# Patient Record
Sex: Female | Born: 1965 | Race: White | Hispanic: Yes | Marital: Married | Smoking: Never smoker
Health system: Southern US, Community
[De-identification: ages and names within clinical notes are randomized; demographics above are authoritative.]

## PROBLEM LIST (undated history)

## (undated) DIAGNOSIS — I10 Essential (primary) hypertension: Secondary | ICD-10-CM

## (undated) DIAGNOSIS — N200 Calculus of kidney: Secondary | ICD-10-CM

## (undated) DIAGNOSIS — C189 Malignant neoplasm of colon, unspecified: Secondary | ICD-10-CM

## (undated) DIAGNOSIS — C801 Malignant (primary) neoplasm, unspecified: Secondary | ICD-10-CM

## (undated) DIAGNOSIS — E119 Type 2 diabetes mellitus without complications: Secondary | ICD-10-CM

---

## 2007-12-30 ENCOUNTER — Emergency Department: Payer: Self-pay | Admitting: Emergency Medicine

## 2007-12-30 ENCOUNTER — Other Ambulatory Visit: Payer: Self-pay

## 2008-01-23 ENCOUNTER — Ambulatory Visit: Payer: Self-pay | Admitting: Specialist

## 2015-02-16 ENCOUNTER — Encounter: Payer: Self-pay | Admitting: Emergency Medicine

## 2015-02-16 ENCOUNTER — Emergency Department
Admission: EM | Admit: 2015-02-16 | Discharge: 2015-02-16 | Disposition: A | Discharge status: Home / Self Care | Payer: BLUE CROSS/BLUE SHIELD | Attending: Emergency Medicine | Admitting: Emergency Medicine

## 2015-02-16 DIAGNOSIS — N200 Calculus of kidney: Secondary | ICD-10-CM

## 2015-02-16 DIAGNOSIS — E119 Type 2 diabetes mellitus without complications: Secondary | ICD-10-CM | POA: Diagnosis not present

## 2015-02-16 DIAGNOSIS — R109 Unspecified abdominal pain: Secondary | ICD-10-CM | POA: Diagnosis present

## 2015-02-16 HISTORY — DX: Type 2 diabetes mellitus without complications: E11.9

## 2015-02-16 HISTORY — DX: Calculus of kidney: N20.0

## 2015-02-16 LAB — URINALYSIS COMPLETE WITH MICROSCOPIC (ARMC ONLY)
Bacteria, UA: NONE SEEN
Bilirubin Urine: NEGATIVE
Glucose, UA: >500 mg/dL — AB
LEUKOCYTES UA: NEGATIVE
Nitrite: NEGATIVE
PH: 5 (ref 5.0–8.0)
PROTEIN: NEGATIVE mg/dL
Specific Gravity, Urine: 1.022 (ref 1.005–1.030)

## 2015-02-16 LAB — CBC WITH DIFFERENTIAL/PLATELET
BASOS PCT: 1 %
Basophils Absolute: 0 10*3/uL (ref 0–0.1)
EOS PCT: 1 %
Eosinophils Absolute: 0.1 10*3/uL (ref 0–0.7)
HCT: 42.5 % (ref 35.0–47.0)
Hemoglobin: 14.3 g/dL (ref 12.0–16.0)
Lymphocytes Relative: 15 %
Lymphs Abs: 1.2 10*3/uL (ref 1.0–3.6)
MCH: 29.5 pg (ref 26.0–34.0)
MCHC: 33.5 g/dL (ref 32.0–36.0)
MCV: 88 fL (ref 80.0–100.0)
MONO ABS: 0.6 10*3/uL (ref 0.2–0.9)
Monocytes Relative: 7 %
Neutro Abs: 6.3 10*3/uL (ref 1.4–6.5)
Neutrophils Relative %: 76 %
Platelets: 259 10*3/uL (ref 150–440)
RBC: 4.83 MIL/uL (ref 3.80–5.20)
RDW: 12.7 % (ref 11.5–14.5)
WBC: 8.2 10*3/uL (ref 3.6–11.0)

## 2015-02-16 LAB — COMPREHENSIVE METABOLIC PANEL
ALK PHOS: 117 U/L (ref 38–126)
ALT: 21 U/L (ref 14–54)
AST: 22 U/L (ref 15–41)
Albumin: 4.1 g/dL (ref 3.5–5.0)
Anion gap: 8 (ref 5–15)
BUN: 20 mg/dL (ref 6–20)
CALCIUM: 9.2 mg/dL (ref 8.9–10.3)
CHLORIDE: 102 mmol/L (ref 101–111)
CO2: 25 mmol/L (ref 22–32)
Creatinine, Ser: 0.79 mg/dL (ref 0.44–1.00)
GFR calc Af Amer: >60 mL/min (ref >60)
Glucose, Bld: 278 mg/dL — ABNORMAL HIGH (ref 65–99)
Potassium: 4.1 mmol/L (ref 3.5–5.1)
SODIUM: 135 mmol/L (ref 135–145)
Total Bilirubin: 0.5 mg/dL (ref 0.3–1.2)
Total Protein: 7.7 g/dL (ref 6.5–8.1)

## 2015-02-16 MED ORDER — HYDROCODONE-ACETAMINOPHEN 5-325 MG PO TABS: 1.0000 | ORAL_TABLET | ORAL | Status: DC | PRN

## 2015-02-16 MED ORDER — NAPROXEN 500 MG PO TABS: 500.0000 mg | ORAL_TABLET | Freq: Two times a day (BID) | ORAL | Status: DC

## 2015-02-16 NOTE — ED Notes (Signed)
Pt arrived to the ED for complaints of flank pain since this AM. Pt states that she "suffers of kidney stones and this feels just like it." Pt denies dysuria or hematuria. Pt is AOx4 in no apparent distress.

## 2015-02-16 NOTE — ED Provider Notes (Signed)
[facility] Emergency Department Provider Note  ____________________________________________  Time seen: On arrival  I have reviewed the triage vital signs and the nursing notes.   HISTORY  Chief Complaint Flank Pain      HPI Penny Huber is a 49 y.o. female who reports she developed flank pain at approximately 7 hours ago today. She has a history of kidney stones in the past and today's episode felt similar. She felt sharp pain in her left flank abruptly the pain would come and go for several hours and was on moderate in nature. She has not had any pain in 3 hours however so she is hopeful that she may have passed it. She denies any fevers chills. No nausea no vomiting. Normal bowel movements     Past Medical History  Diagnosis Date  . Kidney stones   . Diabetes mellitus without complication     There are no active problems to display for this patient.   Past Surgical History  Procedure Laterality Date  . Cesarean section      Current Outpatient Rx  Name  Route  Sig  Dispense  Refill  . HYDROcodone-acetaminophen (NORCO/VICODIN) 5-325 MG per tablet   Oral   Take 1 tablet by mouth every 4 (four) hours as needed for moderate pain.   20 tablet   0   . naproxen (NAPROSYN) 500 MG tablet   Oral   Take 1 tablet (500 mg total) by mouth 2 (two) times daily with a meal.   20 tablet   2     Allergies Review of patient's allergies indicates no known allergies.  History reviewed. No pertinent family history.  Social History History  Substance Use Topics  . Smoking status: Never Smoker   . Smokeless tobacco: Not on file  . Alcohol Use: No    Review of Systems  Constitutional: Negative for fever. Eyes: Negative for visual changes. ENT: Negative for sore throat Cardiovascular: Negative for chest pain. Respiratory: Negative for shortness of breath. Gastrointestinal: Negative for abdominal pain, vomiting and  diarrhea. Genitourinary: Negative for dysuria. Musculoskeletal: Negative for back pain. Skin: Negative for rash. Neurological: Negative for headaches or focal weakness   10-point ROS otherwise negative.  ____________________________________________   PHYSICAL EXAM:  VITAL SIGNS: ED Triage Vitals  Enc Vitals Group     BP 02/16/15 1842 144/76 mmHg     Pulse Rate 02/16/15 1842 88     Resp 02/16/15 1842 18     Temp 02/16/15 1842 98.1 F (36.7 C)     Temp Source 02/16/15 1842 Oral     SpO2 02/16/15 1842 95 %     Weight 02/16/15 1842 178 lb (80.74 kg)     Height 02/16/15 1842 5\' 6"  (1.676 m)     Head Cir --      Peak Flow --      Pain Score 02/16/15 1920 6     Pain Loc --      Pain Edu? --      Excl. in [place]? --      Constitutional: Alert and oriented. Well appearing and in no distress. Eyes: Conjunctivae are normal. PERRL. ENT   Head: Normocephalic and atraumatic.   Nose: No rhinnorhea.   Mouth/Throat: Mucous membranes are moist.  Gastrointestinal: Soft and non-tender in all quadrants. No distention. There is no CVA tenderness. Genitourinary: deferred Musculoskeletal: Nontender with normal range of motion in all extremities. No lower extremity tenderness nor edema. Neurologic:  Normal speech  and language. No gross focal neurologic deficits are appreciated. Skin:  Skin is warm, dry and intact. No rash noted. Psychiatric: Mood and affect are normal. Patient exhibits appropriate insight and judgment.  ____________________________________________    LABS (pertinent positives/negatives)  Labs Reviewed  URINALYSIS COMPLETEWITH MICROSCOPIC ([place] ONLY) - Abnormal; Notable for the following:    Color, Urine STRAW (*)    APPearance CLEAR (*)    Glucose, UA >500 (*)    Ketones, ur TRACE (*)    Hgb urine dipstick 3+ (*)    Squamous Epithelial / LPF 0-5 (*)    All other components within normal limits  COMPREHENSIVE METABOLIC PANEL - Abnormal; Notable for the  following:    Glucose, Bld 278 (*)    All other components within normal limits  CBC WITH DIFFERENTIAL/PLATELET    ____________________________________________   EKG  None  ____________________________________________    RADIOLOGY  None  ____________________________________________   PROCEDURES  Procedure(s) performed: none  Critical Care performed: none  ____________________________________________   INITIAL IMPRESSION / ASSESSMENT AND PLAN / ED COURSE  Pertinent labs & imaging results that were available during my care of the patient were reviewed by me and considered in my medical decision making (see chart for details).  Patient well-appearing and in no distress. Positive hemoglobin and her urinalysis consistent with a kidney stone. Patient has no pain so perhaps she is a past. I'll prescribe stronger analgesic's in case she has a return of her pain. Return precautions given, including fever or chills, nausea vomiting, intractable pain  ____________________________________________   FINAL CLINICAL IMPRESSION(S) / ED DIAGNOSES  Final diagnoses:  Kidney stone     Lavonia Drafts, MD 02/16/15 2021

## 2015-02-16 NOTE — Discharge Instructions (Signed)
Clculos renales (Kidney Stones) Los clculos renales (urolitiasis) son masas slidas que se forman en el interior de los riones. El dolor intenso es causado por el movimiento de la piedra a travs del rin, urter, vejiga y Barista (tracto urinario). Cuando la piedra se mueve, el urter hace un espasmo alrededor de la misma. El clculo generalmente se elimina con el pis (la [place]).  CUIDADOS EN EL HOGAR  Debe ingerir gran cantidad de lquido para mantener la orina de tono claro o color amarillo plido. Esto ayudar a eliminar la piedra.  Cuele la orina con el colador que le han provisto. Noorine de otra forma que no sea a travs del Administrator, ni siquiera una vez. Si elimina la piedra, se retendr en el colador. Puede ser tan pequea como un grano de sal. Llvela a su visita con el mdico. Esto ayudar a que el mdico le indique qu puede hacer para tratar de prevenir la ocurrencia de nuevos clculos renales.  Slo tome los medicamentos que le haya indicado su mdico.  Concurra a las consultas de control con el mdico, segn las indicaciones.  Hgase las radiografas segn las indicaciones de su mdico. SOLICITE AYUDA SI: Siente un dolor que empeora an tomando analgsicos. SOLICITE AYUDA DE INMEDIATO SI:   El dolor no mejora con los medicamentos recetados.  Siente escalofros o fiebre.  El dolor aumenta y Ambulance person en las siguientes 18 horas.  Siente un nuevo dolor en el vientre (abdominal).  Sufre mareos o se desmaya.  Nota que no puede orinar. ASEGRESE DE QUE:   Comprende estas instrucciones.  Controlar su afeccin.  Recibir ayuda de inmediato si no mejora o si empeora. Document Released: 11/17/2008 Document Revised: 04/23/2013 [facility] Patient Information 2015 [place]. This information is not intended to replace advice given to you by your health care provider. Make sure you discuss any questions you have with your health care provider.

## 2015-08-14 ENCOUNTER — Emergency Department
Admission: EM | Admit: 2015-08-14 | Discharge: 2015-08-14 | Disposition: A | Discharge status: Home / Self Care | Payer: BLUE CROSS/BLUE SHIELD | Attending: Emergency Medicine | Admitting: Emergency Medicine

## 2015-08-14 ENCOUNTER — Encounter: Payer: Self-pay | Admitting: Emergency Medicine

## 2015-08-14 ENCOUNTER — Emergency Department: Payer: BLUE CROSS/BLUE SHIELD

## 2015-08-14 DIAGNOSIS — W01198A Fall on same level from slipping, tripping and stumbling with subsequent striking against other object, initial encounter: Secondary | ICD-10-CM | POA: Insufficient documentation

## 2015-08-14 DIAGNOSIS — E119 Type 2 diabetes mellitus without complications: Secondary | ICD-10-CM | POA: Insufficient documentation

## 2015-08-14 DIAGNOSIS — Y9389 Activity, other specified: Secondary | ICD-10-CM | POA: Diagnosis not present

## 2015-08-14 DIAGNOSIS — Y998 Other external cause status: Secondary | ICD-10-CM | POA: Diagnosis not present

## 2015-08-14 DIAGNOSIS — Y9289 Other specified places as the place of occurrence of the external cause: Secondary | ICD-10-CM | POA: Insufficient documentation

## 2015-08-14 DIAGNOSIS — S60221A Contusion of right hand, initial encounter: Secondary | ICD-10-CM

## 2015-08-14 DIAGNOSIS — S6991XA Unspecified injury of right wrist, hand and finger(s), initial encounter: Secondary | ICD-10-CM | POA: Diagnosis present

## 2015-08-14 MED ORDER — NAPROXEN 500 MG PO TABS: 500.0000 mg | ORAL_TABLET | Freq: Two times a day (BID) | ORAL | Status: DC

## 2015-08-14 MED ORDER — HYDROCODONE-ACETAMINOPHEN 5-325 MG PO TABS: 1.0000 | ORAL_TABLET | ORAL | Status: DC | PRN

## 2015-08-14 NOTE — ED Notes (Signed)
States she tried to catch herself when she was falling 2 days ago  Hit hand on rail  Swelling noted to right hand

## 2015-08-14 NOTE — ED Notes (Signed)
Stratus interpretor used. Penny Huber

## 2015-08-14 NOTE — ED Provider Notes (Signed)
[facility] Emergency Department Provider Note  ____________________________________________  Time seen: Approximately 2:26 PM  I have reviewed the triage vital signs and the nursing notes.   HISTORY  Chief Complaint Hand Pain    HPI Penny Huber is a 49 y.o. female resents for evaluation of right hand pain. Husband states that she was trying to catch herself and she is fallen 2 days ago and hit her head on a rail concerned about fracture. Complains of continued pain.   Past Medical History  Diagnosis Date  . Kidney stones   . Diabetes mellitus without complication ([place])     There are no active problems to display for this patient.   Past Surgical History  Procedure Laterality Date  . Cesarean section      Current Outpatient Rx  Name  Route  Sig  Dispense  Refill  . HYDROcodone-acetaminophen (NORCO) 5-325 MG tablet   Oral   Take 1-2 tablets by mouth every 4 (four) hours as needed for moderate pain.   15 tablet   0   . naproxen (NAPROSYN) 500 MG tablet   Oral   Take 1 tablet (500 mg total) by mouth 2 (two) times daily with a meal.   60 tablet   0     Allergies Review of patient's allergies indicates no known allergies.  History reviewed. No pertinent family history.  Social History Social History  Substance Use Topics  . Smoking status: Never Smoker   . Smokeless tobacco: None  . Alcohol Use: No    Review of Systems Constitutional: No fever/chills Eyes: No visual changes. ENT: No sore throat. Cardiovascular: Denies chest pain. Respiratory: Denies shortness of breath. Gastrointestinal: No abdominal pain.  No nausea, no vomiting.  No diarrhea.  No constipation. Genitourinary: Negative for dysuria. Musculoskeletal: Positive for right hand pain Skin: Negative for rash. Neurological: Negative for headaches, focal weakness or numbness.  10-point ROS otherwise  negative.  ____________________________________________   PHYSICAL EXAM:  VITAL SIGNS: ED Triage Vitals  Enc Vitals Group     BP 08/14/15 1327 185/99 mmHg     Pulse Rate 08/14/15 1327 73     Resp 08/14/15 1327 18     Temp 08/14/15 1327 97.8 F (36.6 C)     Temp Source 08/14/15 1327 Oral     SpO2 08/14/15 1327 96 %     Weight 08/14/15 1327 178 lb (80.74 kg)     Height 08/14/15 1327 5\' 5"  (1.651 m)     Head Cir --      Peak Flow --      Pain Score 08/14/15 1328 9     Pain Loc --      Pain Edu? --      Excl. in [place]? --     Constitutional: Alert and oriented. Well appearing and in no acute distress. Cardiovascular: Normal rate, regular rhythm. Grossly normal heart sounds.  Good peripheral circulation. Respiratory: Normal respiratory effort.  No retractions. Lungs CTAB. Gastrointestinal: Soft and nontender. No distention. No abdominal bruits. No CVA tenderness. Musculoskeletal: Right hand with limited range of motion increased tenderness to the lateral aspect of the hand distally neurovascularly intact. Neurologic:  Normal speech and language. No gross focal neurologic deficits are appreciated. No gait instability. Skin:  Skin is warm, dry and intact. No rash noted. Psychiatric: Mood and affect are normal. Speech and behavior are normal.  ____________________________________________   LABS (all labs ordered are listed, but only abnormal results are displayed)  Labs Reviewed - No data to display  RADIOLOGY  FINDINGS: No evidence of acute fracture or dislocation. Joint spaces well preserved. Well-preserved bone mineral density. No intrinsic osseous abnormalities.  IMPRESSION: Normal examination. ____________________________________________   PROCEDURES  Procedure(s) performed: None  Critical Care performed: No  ____________________________________________   INITIAL IMPRESSION / ASSESSMENT AND PLAN / ED COURSE  Pertinent labs & imaging results that were  available during my care of the patient were reviewed by me and considered in my medical decision making (see chart for details).  Right hip pain contusion. Rx given for ibuprofen and Naprosyn 500 mg and hydrocodone 5/325 patient to wear bulky dressing as needed for comfort. Incidental hypertension noted. Patient to follow up with PCP for further evaluation of blood pressure. She voices no other emergency medical complaints at this time ____________________________________________   FINAL CLINICAL IMPRESSION(S) / ED DIAGNOSES  Final diagnoses:  Hand contusion, right, initial encounter      Arlyss Repress, PA-C 08/14/15 1504  Lavonia Drafts, MD 08/14/15 [phone]

## 2015-08-14 NOTE — ED Notes (Signed)
Pt was about to fall and hit hand on rail when trying to catch herself. Right hand began hurting at 4 am today. Mild swelling.

## 2015-08-14 NOTE — Discharge Instructions (Signed)
Contusin en la mano  (Hand Contusion)  Una contusin en la mano es un hematoma profundo en esa zona. Las contusiones son el resultado de una lesin que causa sangrado debajo de la piel. La zona de la contusin puede ponerse [place], [place] o [place]. Las lesiones menores no causan Social research officer, government, Armed forces training and education officer las ms graves pueden presentar dolor e inflamacin durante un par de semanas. CAUSAS  La causa de la contusin generalmente es un golpe, un traumatismo o una fuerza directa ejercida sobre una zona del cuerpo.  SNTOMAS   Hinchazn y enrojecimiento en la zona lesionada.  Cambios de coloracin de la piel en esa zona.  Sensibilidad y Management consultant.  Dolor. DIAGNSTICO  El diagnstico puede hacerse realizando una historia clnica y un examen fsico. Podra ser necesario tomar una radiografa, tomografa computada (TC) o una resonancia magntica (RMN) para determinar si hubo lesiones asociadas, como por ejemplo huesos rotos (fracturas).  TRATAMIENTO  El mejor tratamiento para la contusin en la mano es el reposo, la aplicacin de hielo, la elevacin de la zona y la aplicacin de compresas fras en la zona de la lesin. Para calmar el dolor tambin podrn indicarle medicamentos de venta libre.  INSTRUCCIONES PARA EL CUIDADO EN EL HOGAR   Aplique hielo sobre la zona lesionada.  Ponga el hielo en una bolsa plstica.  Colquese una toalla entre la piel y la bolsa de hielo.  Deje el hielo durante 15 a 20 minutos, 3 a 4 veces por da.  Tome slo medicamentos de venta libre o recetados, segn las indicaciones del mdico. El mdico podr indicarle que evite tomar antiinflamatorios (aspirina, ibuprofeno y naproxeno) durante 71 horas ya que estos medicamentos pueden aumentar los hematomas.  Use un vendaje elstico si se lo indican. Esto ayuda a Forensic psychologist. Puede retirar el vendaje para dormir, darse Collene Mares y baarse. Si los dedos estn adormecidos, fros o de [employer], retire el vendaje y  vuelva a colocarlo de manera ms floja.  Eleve la mano con almohadas para reducir la hinchazn.  Evite el uso excesivo de la mano si le duele. SOLICITE ATENCIN MDICA DE INMEDIATO SI:   Observa un aumento del enrojecimiento, la hinchazn o dolor en la mano.  La hinchazn o el dolor no se [employer].  Tiene prdida de la sensibilidad en la mano o no puede mover los dedos.  La mano est fra o de [employer].  Siente dolor al [employer] dedos.  La mano est caliente al tacto.  La contusin no mejora en el trmino de 2 das. ASEGRESE DE QUE:   Comprende estas instrucciones.  Controlar su enfermedad.  Solicitar ayuda de inmediato si no mejora o si empeora.   Esta informacin no tiene Marine scientist el consejo del mdico. Asegrese de hacerle al mdico cualquier pregunta que tenga.   Document Released: 08/21/2005 Document Revised: 05/15/2012 Elsevier Interactive Patient Education [employer].

## 2019-11-26 ENCOUNTER — Ambulatory Visit
Admission: RE | Admit: 2019-11-26 | Discharge: 2019-11-26 | Disposition: A | Discharge status: Home / Self Care | Payer: Self-pay | Source: Ambulatory Visit | Attending: Oncology | Admitting: Oncology

## 2019-11-26 ENCOUNTER — Ambulatory Visit: Payer: Self-pay | Attending: Oncology

## 2019-11-26 VITALS — BP 142/83 | HR 72 | Temp 97.7°F | Ht 64.0 in | Wt 166.9 lb

## 2019-11-26 DIAGNOSIS — Z0000 Encounter for general adult medical examination without abnormal findings: Secondary | ICD-10-CM | POA: Insufficient documentation

## 2019-11-26 NOTE — Progress Notes (Signed)
  Subjective:     Patient ID: Penny Huber, female   DOB: 1966/06/23, 54 y.o.   MRN: [id number]  HPI   Review of Systems     Objective:   Physical Exam Chest:     Breasts:        Right: No swelling, bleeding, inverted nipple, mass, nipple discharge, skin change or tenderness.        Left: No swelling, bleeding, inverted nipple, mass, nipple discharge, skin change or tenderness.        Assessment:     54 year old Hispanicpatient presents for [facility] clinic visit. Adin Hector interpreted exam.   Patient screened, and meets [facility] eligibility.  Patient does not have insurance, Medicare or Medicaid. Instructed patient on breast self awareness using teach back method.  Clinical breast exam unremarkable. No mass or lump palpted.  Reports cyclical right breast tenderness.  Denies any pain today. Patient will be contacted to reschedule pap. She is still spotting from current menstrual cycle.  Risk Assessment    Risk Scores      11/26/2019   Last edited by: Theodore Demark, RN   5-year risk: 0.8 %   Lifetime risk: 6.1 %             Plan:     Sent for bilateral screening mammogram.

## 2019-12-02 ENCOUNTER — Other Ambulatory Visit: Payer: Self-pay | Admitting: *Deleted

## 2019-12-02 ENCOUNTER — Inpatient Hospital Stay
Admission: RE | Admit: 2019-12-02 | Discharge: 2019-12-02 | Disposition: A | Discharge status: Home / Self Care | Payer: Self-pay | Source: Ambulatory Visit | Attending: *Deleted | Admitting: *Deleted

## 2019-12-02 DIAGNOSIS — Z1231 Encounter for screening mammogram for malignant neoplasm of breast: Secondary | ICD-10-CM

## 2019-12-03 ENCOUNTER — Encounter: Payer: Self-pay | Admitting: *Deleted

## 2019-12-03 ENCOUNTER — Ambulatory Visit: Payer: Self-pay | Attending: Oncology | Admitting: *Deleted

## 2019-12-03 ENCOUNTER — Other Ambulatory Visit: Payer: Self-pay

## 2019-12-03 DIAGNOSIS — R920 Mammographic microcalcification found on diagnostic imaging of breast: Secondary | ICD-10-CM

## 2019-12-03 DIAGNOSIS — Z0000 Encounter for general adult medical examination without abnormal findings: Secondary | ICD-10-CM

## 2019-12-03 NOTE — Progress Notes (Signed)
  Subjective:     Patient ID: Penny Huber, female   DOB: 07-15-1966, 54 y.o.   MRN: [id number]  HPI   Review of Systems     Objective:   Physical Exam Genitourinary:    Exam position: Lithotomy position.     Labia:        Right: No rash, tenderness, lesion or injury.        Left: No rash, tenderness, lesion or injury.      Vagina: No signs of injury and foreign body. No vaginal discharge, erythema, tenderness, bleeding, lesions or prolapsed vaginal walls.     Cervix: Friability present. No cervical motion tenderness.           Assessment:     54 year old Hispanic female returns to [facility] for her pap only.  She was seen in [place] on 11/26/19 and was on her menstrual cycle at that time.  Loyda, the interpreter is present during the interview and exam.  Specimen collected for pap smear without difficulty.  Reviewed patients mammogram and need to return for additional imaging for calcifications.  Appointment given to patient for 12/15/19 @ 2:40.    Plan:     Specimen for pap sent to the lab.  Will follow up per [facility] protocol.

## 2019-12-15 ENCOUNTER — Ambulatory Visit
Admission: RE | Admit: 2019-12-15 | Discharge: 2019-12-15 | Disposition: A | Discharge status: Home / Self Care | Payer: Self-pay | Source: Ambulatory Visit | Attending: Oncology | Admitting: Oncology

## 2019-12-15 DIAGNOSIS — R920 Mammographic microcalcification found on diagnostic imaging of breast: Secondary | ICD-10-CM | POA: Insufficient documentation

## 2019-12-18 ENCOUNTER — Encounter: Payer: Self-pay | Admitting: *Deleted

## 2019-12-18 ENCOUNTER — Other Ambulatory Visit: Payer: Self-pay | Admitting: *Deleted

## 2019-12-18 DIAGNOSIS — R920 Mammographic microcalcification found on diagnostic imaging of breast: Secondary | ICD-10-CM

## 2019-12-18 NOTE — Progress Notes (Signed)
Letter mailed to inform patient of her 6 month follow mammogram on 06/17/20@ 2:00.

## 2019-12-19 ENCOUNTER — Ambulatory Visit: Payer: Self-pay | Attending: Internal Medicine

## 2019-12-19 DIAGNOSIS — Z23 Encounter for immunization: Secondary | ICD-10-CM

## 2019-12-19 NOTE — Progress Notes (Signed)
   Covid-19 Vaccination Clinic  Name:  Penny Huber    MRN: [id number] DOB: 1965/10/20  12/19/2019  Ms. Penny Huber was observed post Covid-19 immunization for 15 minutes without incident. She was provided with Vaccine Information Sheet and instruction to access the V-Safe system.   Ms. Penny Huber was instructed to call 911 with any severe reactions post vaccine: Marland Kitchen Difficulty breathing  . Swelling of face and throat  . A fast heartbeat  . A bad rash all over body  . Dizziness and weakness   Immunizations Administered    Name Date Dose VIS Date Route   Pfizer COVID-19 Vaccine 12/19/2019  8:48 AM 0.3 mL 08/15/2019 Intramuscular   Manufacturer: [place]   Lot: [id number]   [place]: [id number]

## 2020-01-10 ENCOUNTER — Ambulatory Visit: Payer: Self-pay | Attending: Internal Medicine

## 2020-01-10 DIAGNOSIS — Z23 Encounter for immunization: Secondary | ICD-10-CM

## 2020-01-10 NOTE — Progress Notes (Signed)
   Covid-19 Vaccination Clinic  Name:  Penny Huber    MRN: [id number] DOB: 22-May-1966  01/10/2020  Ms. Penny Huber was observed post Covid-19 immunization for 15 minutes without incident. She was provided with Vaccine Information Sheet and instruction to access the V-Safe system.   Ms. Penny Huber was instructed to call 911 with any severe reactions post vaccine: Marland Kitchen Difficulty breathing  . Swelling of face and throat  . A fast heartbeat  . A bad rash all over body  . Dizziness and weakness   Immunizations Administered    Name Date Dose VIS Date Route   Pfizer COVID-19 Vaccine 01/10/2020  8:49 AM 0.3 mL 10/29/2018 Intramuscular   Manufacturer: [place]   Lot: [id number]   [place]: [id number]

## 2020-04-18 ENCOUNTER — Emergency Department: Payer: Self-pay

## 2020-04-18 ENCOUNTER — Other Ambulatory Visit: Payer: Self-pay

## 2020-04-18 ENCOUNTER — Encounter: Payer: Self-pay | Admitting: Emergency Medicine

## 2020-04-18 DIAGNOSIS — Z7984 Long term (current) use of oral hypoglycemic drugs: Secondary | ICD-10-CM | POA: Insufficient documentation

## 2020-04-18 DIAGNOSIS — M5412 Radiculopathy, cervical region: Secondary | ICD-10-CM | POA: Insufficient documentation

## 2020-04-18 DIAGNOSIS — D509 Iron deficiency anemia, unspecified: Secondary | ICD-10-CM | POA: Insufficient documentation

## 2020-04-18 DIAGNOSIS — E119 Type 2 diabetes mellitus without complications: Secondary | ICD-10-CM | POA: Insufficient documentation

## 2020-04-18 DIAGNOSIS — R202 Paresthesia of skin: Secondary | ICD-10-CM | POA: Insufficient documentation

## 2020-04-18 LAB — CBC
HCT: 33.6 % — ABNORMAL LOW (ref 36.0–46.0)
Hemoglobin: 9.7 g/dL — ABNORMAL LOW (ref 12.0–15.0)
MCH: 20.8 pg — ABNORMAL LOW (ref 26.0–34.0)
MCHC: 28.9 g/dL — ABNORMAL LOW (ref 30.0–36.0)
MCV: 72.1 fL — ABNORMAL LOW (ref 80.0–100.0)
Platelets: 414 10*3/uL — ABNORMAL HIGH (ref 150–400)
RBC: 4.66 MIL/uL (ref 3.87–5.11)
RDW: 16.2 % — ABNORMAL HIGH (ref 11.5–15.5)
WBC: 5.9 10*3/uL (ref 4.0–10.5)
nRBC: 0 % (ref 0.0–0.2)

## 2020-04-18 LAB — PROTIME-INR
INR: 0.9 (ref 0.8–1.2)
Prothrombin Time: 12.2 seconds (ref 11.4–15.2)

## 2020-04-18 LAB — COMPREHENSIVE METABOLIC PANEL
ALT: 15 U/L (ref 0–44)
AST: 14 U/L — ABNORMAL LOW (ref 15–41)
Albumin: 3.6 g/dL (ref 3.5–5.0)
Alkaline Phosphatase: 81 U/L (ref 38–126)
Anion gap: 8 (ref 5–15)
BUN: 19 mg/dL (ref 6–20)
CO2: 25 mmol/L (ref 22–32)
Calcium: 8.8 mg/dL — ABNORMAL LOW (ref 8.9–10.3)
Chloride: 105 mmol/L (ref 98–111)
Creatinine, Ser: 0.46 mg/dL (ref 0.44–1.00)
GFR calc Af Amer: >60 mL/min (ref >60)
GFR calc non Af Amer: >60 mL/min (ref >60)
Glucose, Bld: 175 mg/dL — ABNORMAL HIGH (ref 70–99)
Potassium: 3.5 mmol/L (ref 3.5–5.1)
Sodium: 138 mmol/L (ref 135–145)
Total Bilirubin: 0.5 mg/dL (ref 0.3–1.2)
Total Protein: 7.2 g/dL (ref 6.5–8.1)

## 2020-04-18 LAB — APTT: aPTT: 29 seconds (ref 24–36)

## 2020-04-18 LAB — DIFFERENTIAL
Abs Immature Granulocytes: 0.01 10*3/uL (ref 0.00–0.07)
Basophils Absolute: 0 10*3/uL (ref 0.0–0.1)
Basophils Relative: 1 %
Eosinophils Absolute: 0.3 10*3/uL (ref 0.0–0.5)
Eosinophils Relative: 6 %
Immature Granulocytes: 0 %
Lymphocytes Relative: 23 %
Lymphs Abs: 1.4 10*3/uL (ref 0.7–4.0)
Monocytes Absolute: 0.3 10*3/uL (ref 0.1–1.0)
Monocytes Relative: 6 %
Neutro Abs: 3.8 10*3/uL (ref 1.7–7.7)
Neutrophils Relative %: 64 %

## 2020-04-18 NOTE — ED Notes (Signed)
CBG was 161

## 2020-04-18 NOTE — ED Notes (Signed)
Pt c/o left arm numbness and tingling in the left side of her face x 2 weeks, worse over the last 3 days.

## 2020-04-18 NOTE — ED Triage Notes (Signed)
Pt arrived via POV with reports of L arm numbness x 2 weeks, pt states since Friday the numbness occurs 3x per day, pt reports the left side of the face has tingling as well which pt states started on Friday. Pt reports mild headache when she woke up this morning around 11am.   Pt denies any difficulty with speech, no blurred vision. Grip strengths equal, no facial asymmetry.  Denies any hx of stroke or CVA, pt has hx of DM and HTN.

## 2020-04-19 ENCOUNTER — Emergency Department: Payer: Self-pay

## 2020-04-19 ENCOUNTER — Emergency Department
Admission: EM | Admit: 2020-04-19 | Discharge: 2020-04-19 | Disposition: A | Discharge status: Home / Self Care | Payer: Self-pay | Attending: Emergency Medicine | Admitting: Emergency Medicine

## 2020-04-19 DIAGNOSIS — D509 Iron deficiency anemia, unspecified: Secondary | ICD-10-CM

## 2020-04-19 DIAGNOSIS — M5412 Radiculopathy, cervical region: Secondary | ICD-10-CM

## 2020-04-19 DIAGNOSIS — R202 Paresthesia of skin: Secondary | ICD-10-CM

## 2020-04-19 HISTORY — DX: Essential (primary) hypertension: I10

## 2020-04-19 LAB — IRON AND TIBC
Iron: 16 ug/dL — ABNORMAL LOW (ref 28–170)
Saturation Ratios: 4 % — ABNORMAL LOW (ref 10.4–31.8)
TIBC: 447 ug/dL (ref 250–450)
UIBC: 431 ug/dL

## 2020-04-19 LAB — FERRITIN: Ferritin: 3 ng/mL — ABNORMAL LOW (ref 11–307)

## 2020-04-19 LAB — TROPONIN I (HIGH SENSITIVITY): Troponin I (High Sensitivity): <2 ng/L (ref <18)

## 2020-04-19 LAB — HCG, QUANTITATIVE, PREGNANCY: hCG, Beta Chain, Quant, S: <1 m[IU]/mL (ref <5)

## 2020-04-19 LAB — GLUCOSE, CAPILLARY: Glucose-Capillary: 161 mg/dL — ABNORMAL HIGH (ref 70–99)

## 2020-04-19 MED ORDER — IOHEXOL 350 MG/ML SOLN
75.0000 mL | Freq: Once | INTRAVENOUS | Status: AC | PRN
  Administered 2020-04-19: 75 mL via INTRAVENOUS

## 2020-04-19 MED ORDER — IRON 325 (65 FE) MG PO TABS: 1.0000 | ORAL_TABLET | Freq: Every day | ORAL | 2 refills | Status: AC

## 2020-04-19 NOTE — Discharge Instructions (Signed)
Your lab tests show anemia due to low iron levels.  Take an iron supplement once a day to help improve this.  Your CT scan was normal, and your neck and arm symptoms appear to be due to a pinched nerve.  Please follow up with your doctor and orthopedics for further management of this issue.

## 2020-04-19 NOTE — ED Provider Notes (Signed)
[facility] Emergency Department Provider Note  ____________________________________________  Time seen: Approximately 7:56 AM  I have reviewed the triage vital signs and the nursing notes.   HISTORY  Chief Complaint Numbness  Spanish video interpreter Salena Saner used throughout encounter  HPI Penny Huber is a 54 y.o. female with a history of hypertension and non-IDDM who comes the ED complaining of left arm paresthesias for the past 2 weeks, it is intermittent, occurring spontaneously, lasting only a few minutes at a time, involving a loss of sensation and a feeling of heaviness which originates at the left side of the neck and distributes down in the left arm to the wrist.  Seems to be increasing in frequency, and over the past 3 days has been occurring 3 times a day.  Seems to get worse with lifting the left arm above the head.  No alleviating factors.  No radiating pain.    Denies any associated pain such as chest pain abdominal pain or back pain.  No headache or vision changes.  No dizziness or syncope.  No exertional symptoms, no pleuritic symptoms.  No shortness of breath cough fever body aches.  No abdominal pain vomiting black or bloody stool.  Normal oral intake, taking medications compliantly.       Past Medical History:  Diagnosis Date  . Diabetes mellitus without complication ([place])   . Hypertension   . Kidney stones      There are no problems to display for this patient.    Past Surgical History:  Procedure Laterality Date  . CESAREAN SECTION       Prior to Admission medications   Medication Sig Start Date End Date Taking? Authorizing Provider  metFORMIN (GLUCOPHAGE-XR) 500 MG 24 hr tablet Take 500-1,000 mg by mouth daily after supper. 12/04/18  Yes [provider]  Ferrous Sulfate (IRON) 325 (65 Fe) MG TABS Take 1 tablet (325 mg total) by mouth daily. 04/19/20 06/18/20  Carrie Mew, MD  glipiZIDE (GLUCOTROL XL)  10 MG 24 hr tablet Take 10 mg by mouth daily. 03/10/20   [provider]     Allergies Patient has no known allergies.   Family History  Problem Relation Age of Onset  . Breast cancer Neg Hx     Social History Social History   Tobacco Use  . Smoking status: Never Smoker  Substance Use Topics  . Alcohol use: No  . Drug use: No    Review of Systems  Constitutional:   No fever or chills.  ENT:   No sore throat. No rhinorrhea. Cardiovascular:   No chest pain or syncope. Respiratory:   No dyspnea or cough. Gastrointestinal:   Negative for abdominal pain, vomiting and diarrhea.  Musculoskeletal:   Negative for focal pain or swelling All other systems reviewed and are negative except as documented above in ROS and HPI.  ____________________________________________   PHYSICAL EXAM:  VITAL SIGNS: ED Triage Vitals  Enc Vitals Group     BP 04/18/20 1505 140/62     Pulse Rate 04/18/20 1505 90     Resp 04/18/20 1505 16     Temp 04/18/20 1505 98.4 F (36.9 C)     Temp Source 04/18/20 1505 Oral     SpO2 04/18/20 1505 95 %     Weight 04/18/20 1515 167 lb (75.8 kg)     Height 04/18/20 1515 5\' 4"  (1.626 m)     Head Circumference --      Peak Flow --  Pain Score 04/18/20 1515 5     Pain Loc --      Pain Edu? --      Excl. in [place]? --     Vital signs reviewed, nursing assessments reviewed.   Constitutional:   Alert and oriented. Non-toxic appearance. Eyes:   Conjunctivae are normal. EOMI. PERRL. ENT      Head:   Normocephalic and atraumatic.      Nose:   Wearing a mask.      Mouth/Throat:   Wearing a mask.      Neck:   No meningismus. Full ROM.  No midline spinal tenderness.  Thyroid nonpalpable.  The left lateral superior paraspinous musculature is tender to the touch and tense, reproducing some of her symptoms.  Denies pain with lateral rotation of the head against resistance or axial loading of the neck. Hematological/Lymphatic/Immunilogical:   No cervical  lymphadenopathy. Cardiovascular:   RRR. Symmetric bilateral radial and DP pulses.  No murmurs. Cap refill less than 2 seconds. Respiratory:   Normal respiratory effort without tachypnea/retractions. Breath sounds are clear and equal bilaterally. No wheezes/rales/rhonchi. Gastrointestinal:   Soft and nontender. Non distended. There is no CVA tenderness.  No rebound, rigidity, or guarding. Musculoskeletal:   Normal range of motion in all extremities. No joint effusions.  No lower extremity tenderness.  No edema. Neurologic:   Normal speech and language.  Cranial nerves III through XII intact Motor grossly intact. Normal balance and coordination.  Currently symmetric sensation No acute focal neurologic deficits are appreciated.  NIH stroke scale 0 Skin:    Skin is warm, dry and intact. No rash noted.  No petechiae, purpura, or bullae.  ____________________________________________    LABS (pertinent positives/negatives) (all labs ordered are listed, but only abnormal results are displayed) Labs Reviewed  CBC - Abnormal; Notable for the following components:      Result Value   Hemoglobin 9.7 (*)    HCT 33.6 (*)    MCV 72.1 (*)    MCH 20.8 (*)    MCHC 28.9 (*)    RDW 16.2 (*)    Platelets 414 (*)    All other components within normal limits  COMPREHENSIVE METABOLIC PANEL - Abnormal; Notable for the following components:   Glucose, Bld 175 (*)    Calcium 8.8 (*)    AST 14 (*)    All other components within normal limits  IRON AND TIBC - Abnormal; Notable for the following components:   Iron 16 (*)    Saturation Ratios 4 (*)    All other components within normal limits  FERRITIN - Abnormal; Notable for the following components:   Ferritin 3 (*)    All other components within normal limits  PROTIME-INR  APTT  DIFFERENTIAL  HCG, QUANTITATIVE, PREGNANCY  CBG MONITORING, ED  POC URINE PREG, ED  TROPONIN I (HIGH SENSITIVITY)    ____________________________________________   EKG  Interpreted by me  Date: 04/19/2020  Rate: 87  Rhythm: normal sinus rhythm  QRS Axis: normal  Intervals: normal  ST/T Wave abnormalities: normal  Conduction Disutrbances: none  Narrative Interpretation: unremarkable      ____________________________________________    RADIOLOGY  CT HEAD WO CONTRAST  Result Date: 04/18/2020 CLINICAL DATA:  Left arm numbness for 2 weeks. EXAM: CT HEAD WITHOUT CONTRAST TECHNIQUE: Contiguous axial images were obtained from the base of the skull through the vertex without intravenous contrast. COMPARISON:  None. FINDINGS: Brain: No evidence of acute infarction, hemorrhage, hydrocephalus, extra-axial collection or mass lesion/mass  effect. Vascular: No hyperdense vessel or unexpected calcification. Skull: Normal. Negative for fracture or focal lesion. Sinuses/Orbits: Left ethmoid sinus disease is partially imaged. Other: None. IMPRESSION: 1. No acute intracranial process. Electronically Signed   By: Zerita Boers M.D.   On: 04/18/2020 15:47   CT ANGIO CHEST AORTA W/CM & OR WO/CM  Result Date: 04/19/2020 CLINICAL DATA:  Left arm numbness and left facial numbness. Aortic disease nontraumatic. EXAM: CT ANGIOGRAPHY CHEST WITH CONTRAST TECHNIQUE: Multidetector CT imaging of the chest was performed using the standard protocol during bolus administration of intravenous contrast. Multiplanar CT image reconstructions and MIPs were obtained to evaluate the vascular anatomy. CONTRAST:  68mL OMNIPAQUE IOHEXOL 350 MG/ML SOLN COMPARISON:  None. FINDINGS: Cardiovascular: Minimal atherosclerotic calcification along the aortic arch. Heart is at the upper limits of normal in size. No pericardial effusion. Mediastinum/Nodes: No pathologically enlarged mediastinal, hilar or axillary lymph nodes. Esophagus is unremarkable. Lungs/Pleura: Mild dependent atelectasis bilaterally. Lungs are otherwise clear. No pleural fluid.  Airway is unremarkable. Upper Abdomen: Visualized portions of the liver, gallbladder and adrenal glands are unremarkable. Low-attenuation lesion in the upper pole right kidney is partially imaged. Visualized portions of the left kidney, spleen, pancreas, stomach and bowel are unremarkable. Musculoskeletal: Degenerative changes in the spine. No worrisome lytic or sclerotic lesions. Review of the MIP images confirms the above findings. IMPRESSION: 1. No acute findings. 2.  Aortic atherosclerosis (ICD10-I70.0). Electronically Signed   By: Lorin Picket M.D.   On: 04/19/2020 09:39    ____________________________________________   PROCEDURES Procedures  ____________________________________________  DIFFERENTIAL DIAGNOSIS   Peripheral nerve impingement, cervical radiculopathy, aortic dissection, thoracic outlet obstruction, subclavian stenosis, non-STEMI  CLINICAL IMPRESSION / ASSESSMENT AND PLAN / ED COURSE  Medications ordered in the ED: Medications  iohexol (OMNIPAQUE) 350 MG/ML injection 75 mL (75 mLs Intravenous Contrast Given 04/19/20 9562)    Pertinent labs & imaging results that were available during my care of the patient were reviewed by me and considered in my medical decision making (see chart for details).  Penny Huber was evaluated in Emergency Department on 04/19/2020 for the symptoms described in the history of present illness. She was evaluated in the context of the global COVID-19 pandemic, which necessitated consideration that the patient might be at risk for infection with the SARS-CoV-2 virus that causes COVID-19. Institutional protocols and algorithms that pertain to the evaluation of patients at risk for COVID-19 are in a state of rapid change based on information released by regulatory bodies including the CDC and federal and state organizations. These policies and algorithms were followed during the patient's care in the ED.   Patient presents with 2 weeks of  intermittent vague symptoms.  Vital signs are unremarkable.  On exam she is very well-appearing nontoxic and comfortable.  Considering the patient's symptoms, medical history, and physical examination today, I have low suspicion for ischemic stroke, intracranial hemorrhage, meningitis, encephalitis, carotid or vertebral dissection, venous sinus thrombosis, MS, intracranial hypertension, glaucoma, or temporal arteritis.  CT head is unremarkable.  Low suspicion for ACS, with 2 weeks of symptoms and a normal EKG, a single troponin can rule out underlying MI, doubt unstable angina given lack of exertional symptoms or pain.  Doubt PE.    I think most likely this is peripheral nerve impingement or cervical radiculopathy.  Recommended stretching, massage, heat therapy, range of motion exercise, engaged in shared decision making with patient regarding work-up of a thoracic vascular problem given that she is very well-appearing currently and considering  her associated report of some loss of strength when the symptoms occur despite a currently normal exam.  She does wish to proceed with a CT scan of the chest at this time.   Clinical Course as of Apr 19 1008  Mon Apr 19, 2020  0808 Initial labs all normal including metabolic panel, coags.  CBC shows hemoglobin of 9.7 with parameters suggestive of iron deficiency anemia.  Will add on iron panel.   [PS]  0940 Iron panel confirms iron deficiency anemia, will recommend supplementation.   [PS]    Clinical Course User Index [PS] Carrie Mew, MD    ----------------------------------------- 10:09 AM on 04/19/2020 -----------------------------------------  CT scan unremarkable.  Troponin negative.  Evaluation consistent with a cervical radiculopathy, stable for outpatient follow-up with PCP and orthopedics.   ____________________________________________   FINAL CLINICAL IMPRESSION(S) / ED DIAGNOSES    Final diagnoses:  Arm paresthesia, left   Iron deficiency anemia, unspecified iron deficiency anemia type  Cervical radiculopathy     ED Discharge Orders         Ordered    Ferrous Sulfate (IRON) 325 (65 Fe) MG TABS  Daily     Discontinue  Reprint     04/19/20 1009          Portions of this note were generated with dragon dictation software. Dictation errors may occur despite best attempts at proofreading.   Carrie Mew, MD 04/19/20 1010

## 2020-06-17 ENCOUNTER — Other Ambulatory Visit: Payer: Self-pay

## 2020-06-17 ENCOUNTER — Ambulatory Visit
Admission: RE | Admit: 2020-06-17 | Discharge: 2020-06-17 | Disposition: A | Discharge status: Home / Self Care | Payer: Self-pay | Source: Ambulatory Visit | Attending: Oncology | Admitting: Oncology

## 2020-06-17 DIAGNOSIS — R920 Mammographic microcalcification found on diagnostic imaging of breast: Secondary | ICD-10-CM

## 2020-06-21 ENCOUNTER — Encounter: Payer: Self-pay | Admitting: *Deleted

## 2020-06-21 ENCOUNTER — Other Ambulatory Visit: Payer: Self-pay | Admitting: *Deleted

## 2020-06-21 DIAGNOSIS — R920 Mammographic microcalcification found on diagnostic imaging of breast: Secondary | ICD-10-CM

## 2020-06-21 NOTE — Progress Notes (Signed)
Patient with a birads 3 mammogram.  Mailed letter with appointment for her next BCCCP screening and mammogram for 12/21/20 @ 8:30 and 10:00.

## 2020-12-21 ENCOUNTER — Ambulatory Visit: Payer: Self-pay

## 2020-12-21 ENCOUNTER — Inpatient Hospital Stay: Admission: RE | Admit: 2020-12-21 | Payer: Self-pay | Source: Ambulatory Visit

## 2020-12-21 ENCOUNTER — Encounter: Payer: Self-pay | Admitting: Oncology

## 2020-12-21 ENCOUNTER — Other Ambulatory Visit: Payer: Self-pay

## 2021-01-18 ENCOUNTER — Ambulatory Visit
Admission: RE | Admit: 2021-01-18 | Discharge: 2021-01-18 | Disposition: A | Discharge status: Home / Self Care | Payer: Self-pay | Source: Ambulatory Visit | Attending: Oncology | Admitting: Oncology

## 2021-01-18 ENCOUNTER — Ambulatory Visit: Payer: Medicaid Other | Attending: Oncology

## 2021-01-18 ENCOUNTER — Other Ambulatory Visit: Payer: Self-pay

## 2021-01-18 VITALS — BP 124/81 | HR 81 | Temp 97.1°F | Ht 64.0 in | Wt 147.1 lb

## 2021-01-18 DIAGNOSIS — R920 Mammographic microcalcification found on diagnostic imaging of breast: Secondary | ICD-10-CM

## 2021-01-18 NOTE — Progress Notes (Signed)
  Subjective:     Patient ID: Penny Huber, female   DOB: 1966-06-05, 55 y.o.   MRN: [id number]  HPI   Review of Systems     Objective:   Physical Exam Chest:  Breasts:     Right: No swelling, bleeding, inverted nipple, mass, nipple discharge, skin change or tenderness.     Left: No swelling, bleeding, inverted nipple, mass, nipple discharge, skin change or tenderness.        Comments: portacath right upper chest       Assessment:     55 year old Hispanic patient presents for [facility] visit. She is here for a six month follow-up calcifications,  and annual mammogram  Penny Huber interpreted.Patient screened, and meets BCCCP eligibility.  Patient does not have insurance, Medicare or Medicaid. Instructed patient on breast self awareness using teach back method.  Clinical breast exam unremarkable.  No mass or lump palpated.  Patient diagnosed 08/2020 with colon cancer.  She is receiving treatment at [facility].  She has 4 mor chemotherapy treatments.  Risk Assessment    Risk Scores      01/18/2021 11/26/2019   Last edited by: Rico Junker, RN Theodore Demark, RN   5-year risk: 0.8 % 0.8 %   Lifetime risk: 5.9 % 6.1 %            Plan:     Sent for bilateral screening mammogram.

## 2021-01-21 NOTE — Progress Notes (Signed)
Radiologist reviewd Birads 3 results with patient.  Patient scheduled for annual diagnostic mammogram . Mailed appointment information.

## 2021-01-24 NOTE — Progress Notes (Signed)
Radiologist reviewed Birads 3 results with patient,  Scheduled to return for annual BCCCP screening,and diagnostic mammogram, 01/18/2022 at 1:30p.m.  Appointment reminder mailed.

## 2021-07-20 IMAGING — MG DIGITAL SCREENING BILAT W/ TOMO W/ CAD
8 series · 8 of 24 positions shown · non-contrast
Comparison: Previous exam(s).

CLINICAL DATA: Screening.

EXAM:
DIGITAL SCREENING BILATERAL MAMMOGRAM WITH TOMO AND CAD

[L MLO synth-2D]
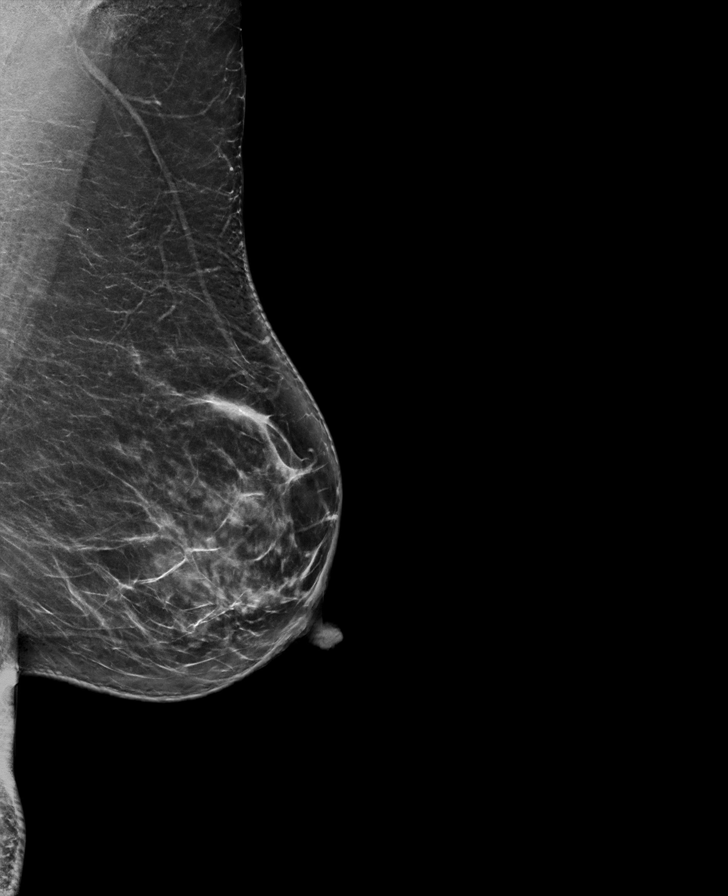

[L CC synth-2D]
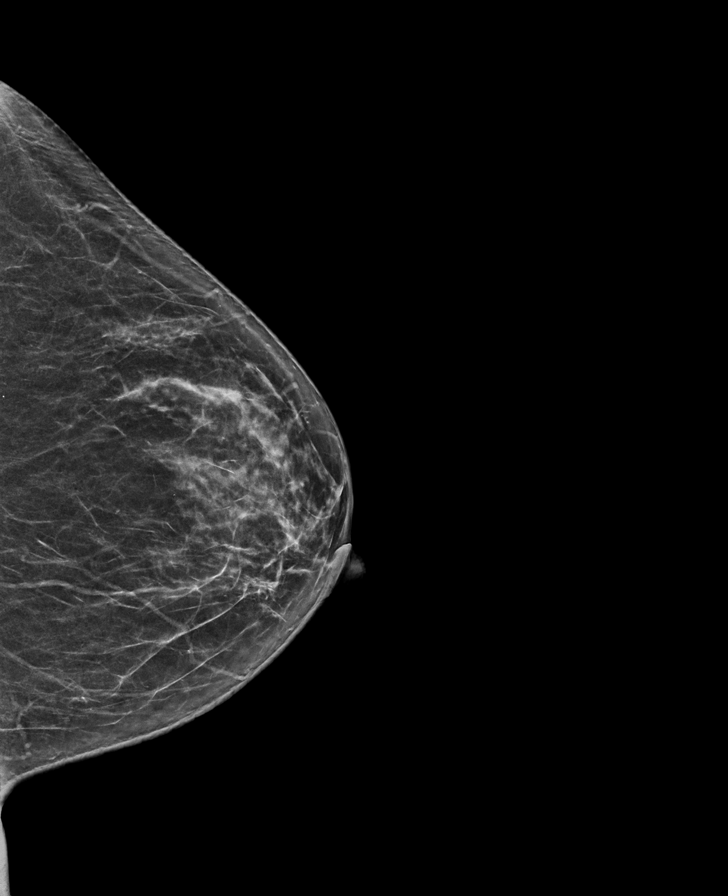

[R CC synth-2D]
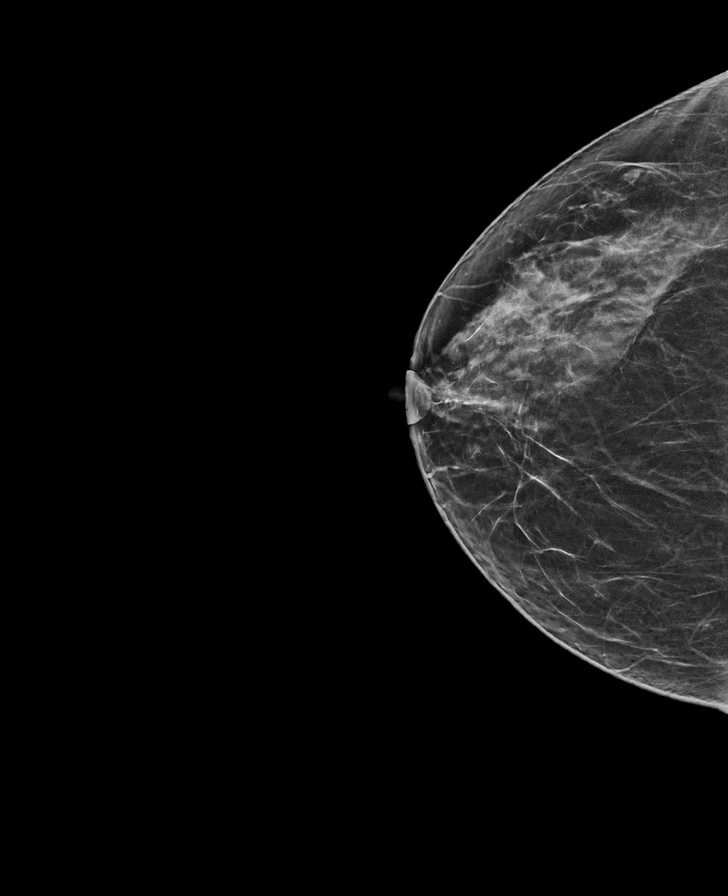

[R MLO synth-2D]
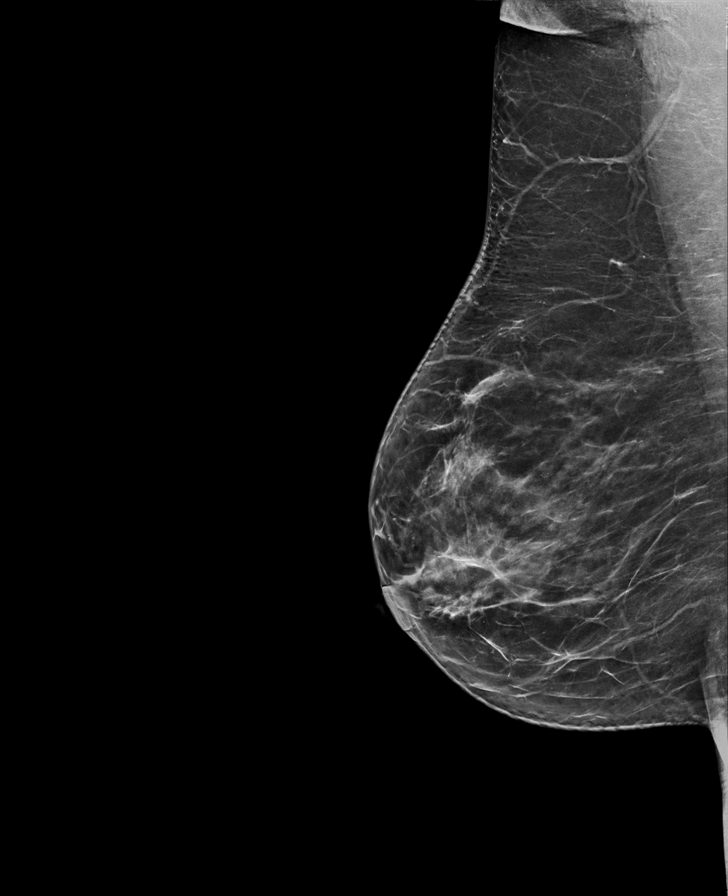

[R CC tomo · tomo slice 32/63.0]
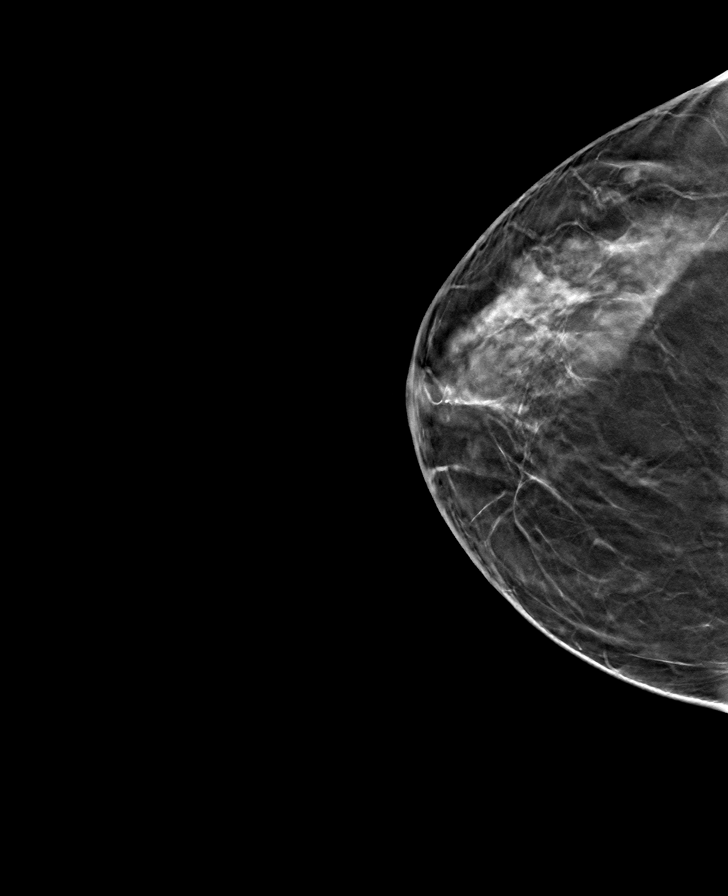

[L CC tomo · tomo slice 32/63.0]
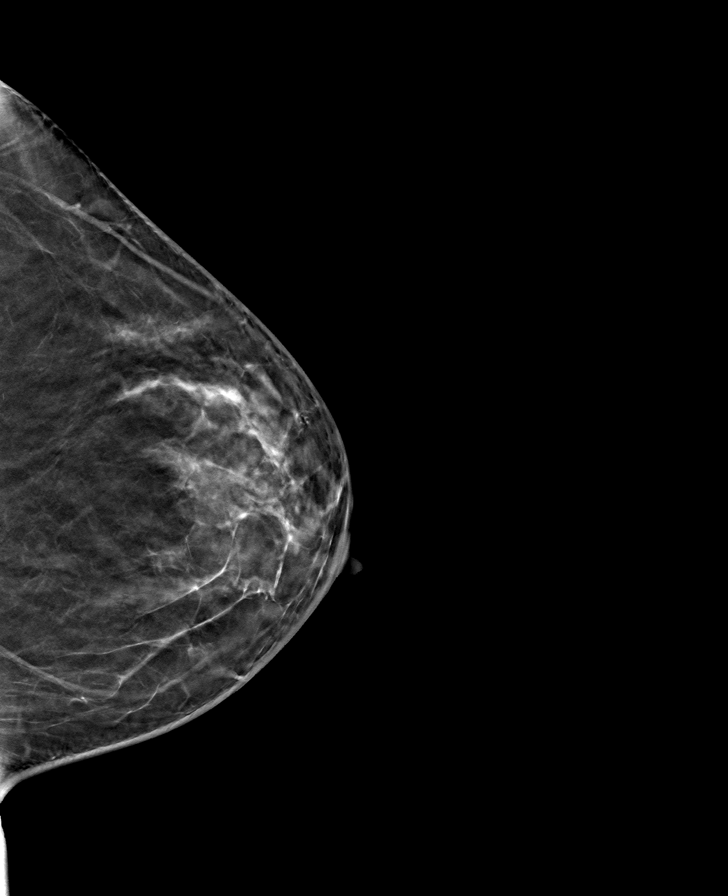

[R MLO tomo · tomo slice 37/74.0]
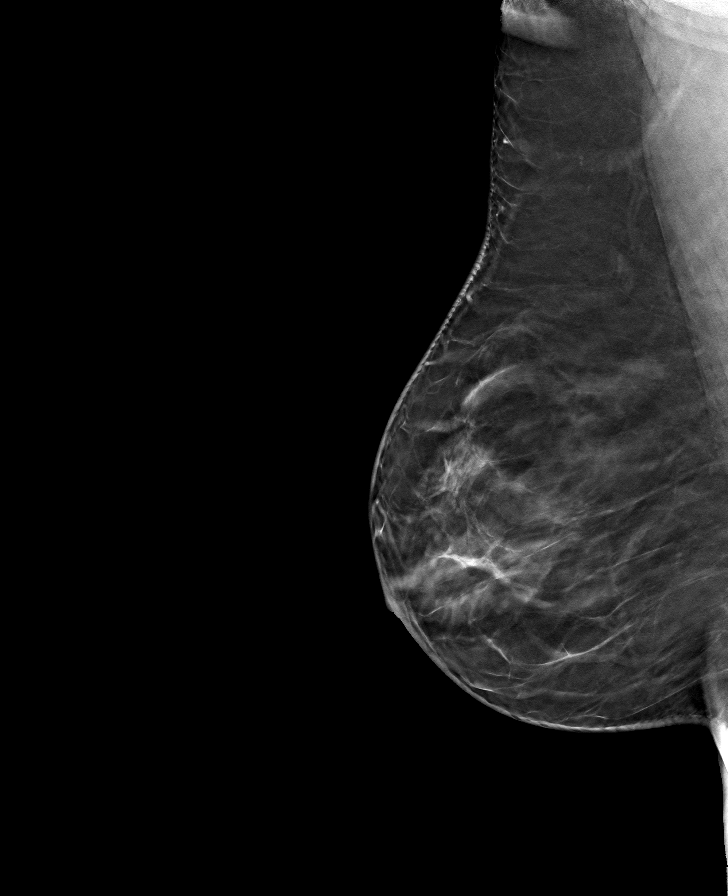

[L MLO tomo · tomo slice 37/74.0]
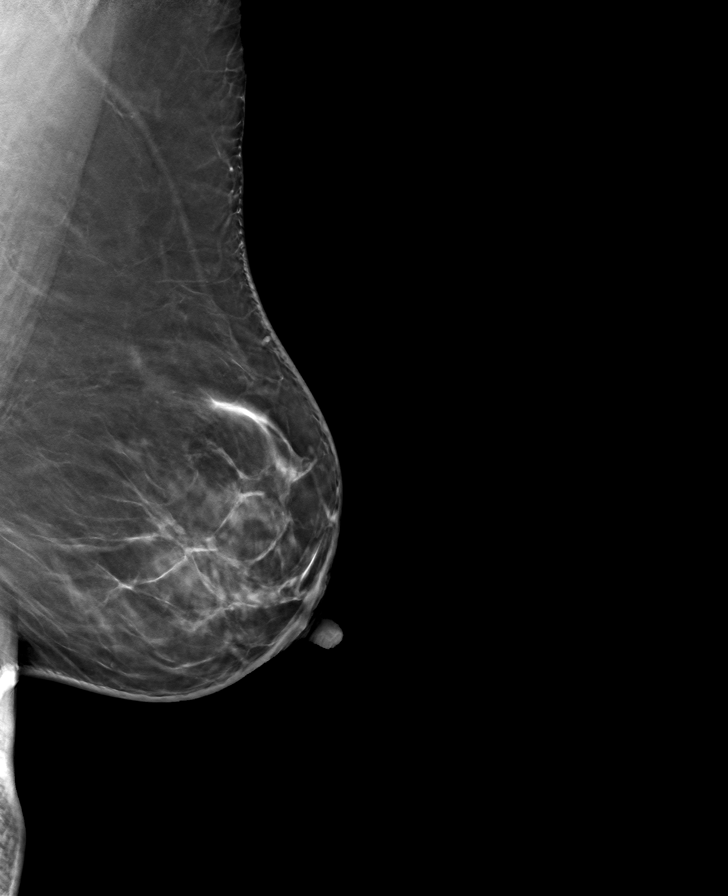

[8 of 24 positions shown; findings below may reference images not displayed]

ACR Breast Density Category c: The breast tissue is heterogeneously
dense, which may obscure small masses.
FINDINGS: In the right breast a group of calcifications requires further
evaluation.

In the left breast a group of calcifications requires further
evaluation.

Images were processed with CAD.
IMPRESSION: Further evaluation is suggested for possible calcifications in the
right breast.

Further evaluation is suggested for possible calcifications in the
left breast.

RECOMMENDATION:
Diagnostic mammogram of both breasts. (Code:[id number])

The patient will be contacted regarding the findings, and additional
imaging will be scheduled.

BI-RADS CATEGORY  0: Incomplete. Need additional imaging evaluation
and/or prior mammograms for comparison.

## 2021-12-23 ENCOUNTER — Emergency Department
Admission: EM | Admit: 2021-12-23 | Discharge: 2021-12-23 | Disposition: A | Discharge status: Home / Self Care | Payer: Medicaid Other | Attending: Emergency Medicine | Admitting: Emergency Medicine

## 2021-12-23 ENCOUNTER — Encounter: Payer: Self-pay | Admitting: Emergency Medicine

## 2021-12-23 ENCOUNTER — Other Ambulatory Visit: Payer: Self-pay

## 2021-12-23 ENCOUNTER — Emergency Department: Payer: Medicaid Other

## 2021-12-23 DIAGNOSIS — H66002 Acute suppurative otitis media without spontaneous rupture of ear drum, left ear: Secondary | ICD-10-CM | POA: Diagnosis not present

## 2021-12-23 DIAGNOSIS — H9202 Otalgia, left ear: Secondary | ICD-10-CM | POA: Diagnosis present

## 2021-12-23 DIAGNOSIS — M546 Pain in thoracic spine: Secondary | ICD-10-CM | POA: Insufficient documentation

## 2021-12-23 HISTORY — DX: Malignant neoplasm of colon, unspecified: C18.9

## 2021-12-23 HISTORY — DX: Malignant (primary) neoplasm, unspecified: C80.1

## 2021-12-23 MED ORDER — AMOXICILLIN 875 MG PO TABS: 875.0000 mg | ORAL_TABLET | Freq: Two times a day (BID) | ORAL | 0 refills | Status: AC

## 2021-12-23 MED ORDER — AMOXICILLIN 500 MG PO CAPS
1000.0000 mg | ORAL_CAPSULE | Freq: Once | ORAL | Status: AC
  Administered 2021-12-23: 1000 mg via ORAL
  Filled 2021-12-23: qty 2

## 2021-12-23 MED ORDER — KETOROLAC TROMETHAMINE 30 MG/ML IJ SOLN
30.0000 mg | Freq: Once | INTRAMUSCULAR | Status: AC
  Administered 2021-12-23: 30 mg via INTRAMUSCULAR
  Filled 2021-12-23: qty 1

## 2021-12-23 MED ORDER — MELOXICAM 15 MG PO TABS: 15.0000 mg | ORAL_TABLET | Freq: Every day | ORAL | 0 refills | Status: AC

## 2021-12-23 MED ORDER — CYCLOBENZAPRINE HCL 5 MG PO TABS: 5.0000 mg | ORAL_TABLET | Freq: Three times a day (TID) | ORAL | 0 refills | Status: AC | PRN

## 2021-12-23 NOTE — Discharge Instructions (Addendum)
Please take antibiotic as prescribed for your left ear infection.  Please take muscle relaxer and anti-inflammatory medication as prescribed for your back pain.  Return to the ER for any fevers increasing pain worsening symptoms or any changes in health. ?

## 2021-12-23 NOTE — ED Provider Notes (Signed)
?[facility] EMERGENCY DEPARTMENT ?Provider Note ? ? ?CSN: [id number] ?Arrival date & time: 12/23/21  1741 ? ?  ? ?History ? ?Chief Complaint  ?Patient presents with  ? Back Pain  ? ? ?Penny Huber is a 56 y.o. female presents to the emergency department for evaluation of left ear pain and left lower thoracic back pain.  Patient's had some left ear pressure that is progressed to pain over the last 24 hours.  She also complains of nontraumatic back pain, muscle spasms along the left thoracic spine.  She denies any chest pain, shortness of breath, abdominal pain, urinary symptoms, numbness tingling radicular symptoms.  She denies any fevers.  Ear pain was moderate but very mild with Tylenol. ? ?HPI ? ?  ? ?Home Medications ?Prior to Admission medications   ?Medication Sig Start Date End Date Taking? Authorizing Provider  ?amoxicillin (AMOXIL) 875 MG tablet Take 1 tablet (875 mg total) by mouth 2 (two) times daily. X 10 days 12/23/21  Yes Duanne Guess, PA-C  ?cyclobenzaprine (FLEXERIL) 5 MG tablet Take 1-2 tablets (5-10 mg total) by mouth 3 (three) times daily as needed for muscle spasms. 12/23/21  Yes Duanne Guess, PA-C  ?meloxicam (MOBIC) 15 MG tablet Take 1 tablet (15 mg total) by mouth daily. 12/23/21 12/23/22 Yes Duanne Guess, PA-C  ?Ferrous Sulfate (IRON) 325 (65 Fe) MG TABS Take 1 tablet (325 mg total) by mouth daily. 04/19/20 06/18/20  Carrie Mew, MD  ?glipiZIDE (GLUCOTROL XL) 10 MG 24 hr tablet Take 10 mg by mouth daily. 03/10/20   [provider]  ?metFORMIN (GLUCOPHAGE-XR) 500 MG 24 hr tablet Take 500-1,000 mg by mouth daily after supper. 12/04/18   [provider]  ?   ? ?Allergies    ?Patient has no known allergies.   ? ?Review of Systems   ?Review of Systems ? ?Physical Exam ?Updated Vital Signs ?BP (!) 147/92 (BP Location: Left Arm)   Pulse 91   Temp 99.2 ?F (37.3 ?C) (Oral)   Resp 18   Ht '5\' 3"'$  (1.6 m)   Wt 76.7 kg   SpO2 97%   BMI 29.94  kg/m?  ?Physical Exam ?Constitutional:   ?   Appearance: She is well-developed.  ?HENT:  ?   Head: Normocephalic and atraumatic.  ?   Comments: Left TM erythematous/bulging, right TM normal.  Bilateral TMs intact ?Eyes:  ?   Conjunctiva/sclera: Conjunctivae normal.  ?Cardiovascular:  ?   Rate and Rhythm: Normal rate.  ?Pulmonary:  ?   Effort: Pulmonary effort is normal. No respiratory distress.  ?Abdominal:  ?   General: There is no distension.  ?   Tenderness: There is no abdominal tenderness. There is no right CVA tenderness, left CVA tenderness or guarding.  ?Musculoskeletal:     ?   General: Normal range of motion.  ?   Cervical back: Normal range of motion.  ?   Comments: Thoracic spine tender along the left medial scapular border, no spinous process tenderness.  No swelling, ecchymosis.  ?Skin: ?   General: Skin is warm.  ?   Findings: No rash.  ?Neurological:  ?   Mental Status: She is alert and oriented to person, place, and time.  ?Psychiatric:     ?   Behavior: Behavior normal.     ?   Thought Content: Thought content normal.  ? ? ?ED Results / Procedures / Treatments   ?Labs ?(all labs ordered are listed, but only  abnormal results are displayed) ?Labs Reviewed - No data to display ? ?EKG ?None ? ?Radiology ?DG Thoracic Spine 2 View ? ?Result Date: 12/23/2021 ?CLINICAL DATA:  Left-sided back pain. EXAM: THORACIC SPINE 2 VIEWS COMPARISON:  None. FINDINGS: Right Port-A-Cath in place with the tip in the right atrium. Normal alignment. No fracture or focal bone lesion. Early degenerative spurring anteriorly. IMPRESSION: No acute bony abnormality. Electronically Signed   By: Rolm Baptise M.D.   On: 12/23/2021 19:45   ? ?Procedures ?Procedures  ? ? ?Medications Ordered in ED ?Medications  ?ketorolac (TORADOL) 30 MG/ML injection 30 mg (30 mg Intramuscular Given 12/23/21 1909)  ?amoxicillin (AMOXIL) capsule 1,000 mg (1,000 mg Oral Given 12/23/21 1909)  ? ? ?ED Course/ Medical Decision Making/ A&P ?  ?                         ?Medical Decision Making ?Amount and/or Complexity of Data Reviewed ?Radiology: ordered. ? ?Risk ?Prescription drug management. ? ? ?56 year old female with left ear pain and left-sided thoracic back pain.  Ear pain consistent with otitis media, she has erythematous bulging TM with no bleeding or drainage.  TM is fully intact.  Her back pain consistent with muscle spasms, x-ray negative for any acute abnormal findings.  She had complete relief with Toradol injection.  She was sent home with Mobic and Flexeril to take as needed.  She will follow-up with orthopedics if no improvement 1 week.  For otitis media she will take amoxicillin twice daily for 10 days. ?Final Clinical Impression(s) / ED Diagnoses ?Final diagnoses:  ?Non-recurrent acute suppurative otitis media of left ear without spontaneous rupture of tympanic membrane  ?Acute left-sided thoracic back pain  ? ? ?Rx / DC Orders ?ED Discharge Orders   ? ?      Ordered  ?  amoxicillin (AMOXIL) 875 MG tablet  2 times daily       ? 12/23/21 2012  ?  cyclobenzaprine (FLEXERIL) 5 MG tablet  3 times daily PRN       ? 12/23/21 2012  ?  meloxicam (MOBIC) 15 MG tablet  Daily       ? 12/23/21 2012  ? ?  ?  ? ?  ? ? ?  ?Duanne Guess, PA-C ?12/23/21 2015 ? ?  ?Naaman Plummer, MD ?12/24/21 1152 ? ?

## 2021-12-23 NOTE — ED Triage Notes (Signed)
Per interpreter, pt reports last pm started with lower back pain tp her left side. Pt reports pain does not radiate anywhere else and gets worse when she lays down. Pt denies heavy lifting or twisting or straining. Pt also reports some pain to her left ear. Pt denies urinary sx's, cough, congestion or other sx's.  ?

## 2021-12-23 NOTE — ED Notes (Signed)
Pt given a warm blanket. Pt denies any other needs currently.  ?

## 2021-12-23 NOTE — ED Notes (Addendum)
Interpreter [phone] assisting this RN. See triage note. Pt sitting calmly on stretcher; skin dry; resp reg/unlabored currently.  ?

## 2021-12-30 ENCOUNTER — Other Ambulatory Visit: Payer: Self-pay

## 2021-12-30 DIAGNOSIS — R920 Mammographic microcalcification found on diagnostic imaging of breast: Secondary | ICD-10-CM

## 2022-01-03 ENCOUNTER — Ambulatory Visit: Payer: Medicaid Other

## 2022-01-18 ENCOUNTER — Ambulatory Visit: Payer: Medicaid Other

## 2022-02-09 NOTE — Addendum Note (Signed)
Addended by: Demetrius Revel on: 02/09/2022 04:44 PM   Modules accepted: Orders

## 2022-02-13 ENCOUNTER — Encounter: Payer: Self-pay | Admitting: Physician Assistant

## 2022-02-13 ENCOUNTER — Other Ambulatory Visit: Payer: Self-pay | Admitting: Physician Assistant

## 2022-02-13 DIAGNOSIS — R928 Other abnormal and inconclusive findings on diagnostic imaging of breast: Secondary | ICD-10-CM

## 2022-02-14 ENCOUNTER — Ambulatory Visit: Payer: Medicaid Other

## 2023-10-04 ENCOUNTER — Encounter: Payer: Self-pay | Admitting: Primary Care

## 2023-10-08 ENCOUNTER — Other Ambulatory Visit: Payer: Self-pay | Admitting: Primary Care

## 2023-10-08 DIAGNOSIS — R921 Mammographic calcification found on diagnostic imaging of breast: Secondary | ICD-10-CM

## 2023-10-08 DIAGNOSIS — Z1231 Encounter for screening mammogram for malignant neoplasm of breast: Secondary | ICD-10-CM

## 2023-11-22 ENCOUNTER — Ambulatory Visit
Admission: RE | Admit: 2023-11-22 | Discharge: 2023-11-22 | Disposition: A | Discharge status: Home / Self Care | Payer: Medicaid Other | Source: Ambulatory Visit | Attending: Primary Care | Admitting: Primary Care

## 2023-11-22 DIAGNOSIS — R921 Mammographic calcification found on diagnostic imaging of breast: Secondary | ICD-10-CM | POA: Insufficient documentation

## 2023-11-22 DIAGNOSIS — Z1231 Encounter for screening mammogram for malignant neoplasm of breast: Secondary | ICD-10-CM | POA: Diagnosis present
